# Patient Record
Sex: Female | Born: 1995 | Hispanic: Yes | Marital: Single | Smoking: Never smoker
Health system: Southern US, Community
[De-identification: ages and names within clinical notes are randomized; demographics above are authoritative.]

## PROBLEM LIST (undated history)

## (undated) ENCOUNTER — Inpatient Hospital Stay (HOSPITAL_COMMUNITY): Payer: Self-pay

---
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
## 2015-10-23 ENCOUNTER — Encounter: Payer: Self-pay | Admitting: Pediatrics

## 2015-10-24 ENCOUNTER — Encounter: Payer: Self-pay | Admitting: Pediatrics

## 2016-05-06 ENCOUNTER — Ambulatory Visit: Payer: Medicaid Other | Admitting: Pediatrics

## 2016-05-06 ENCOUNTER — Ambulatory Visit (INDEPENDENT_AMBULATORY_CARE_PROVIDER_SITE_OTHER): Payer: Medicaid Other | Admitting: Family

## 2016-05-06 ENCOUNTER — Encounter: Payer: Self-pay | Admitting: Family

## 2016-05-06 VITALS — BP 91/54 | HR 84 | Ht 58.5 in | Wt 93.6 lb

## 2016-05-06 DIAGNOSIS — Z113 Encounter for screening for infections with a predominantly sexual mode of transmission: Secondary | ICD-10-CM

## 2016-05-06 DIAGNOSIS — Z3049 Encounter for surveillance of other contraceptives: Secondary | ICD-10-CM

## 2016-05-06 DIAGNOSIS — Z3046 Encounter for surveillance of implantable subdermal contraceptive: Secondary | ICD-10-CM

## 2016-05-06 DIAGNOSIS — Z30433 Encounter for removal and reinsertion of intrauterine contraceptive device: Secondary | ICD-10-CM | POA: Diagnosis not present

## 2016-05-06 DIAGNOSIS — Z30017 Encounter for initial prescription of implantable subdermal contraceptive: Secondary | ICD-10-CM

## 2016-05-06 MED ORDER — ETONOGESTREL 68 MG ~~LOC~~ IMPL
68.0000 mg | DRUG_IMPLANT | Freq: Once | SUBCUTANEOUS | Status: AC
  Administered 2016-05-06: 68 mg via SUBCUTANEOUS

## 2016-05-06 NOTE — Patient Instructions (Signed)
Follow-up  in 1 month. Schedule this appointment before you leave clinic today.  Congratulations on getting your Nexplanon placement!  Below is some important information about Nexplanon.  First remember that Nexplanon does not prevent sexually transmitted infections.  Condoms will help prevent sexually transmitted infections. The Nexplanon starts working 7 days after it was inserted.  There is a risk of getting pregnant if you have unprotected sex in those first 7 days after placement of the Nexplanon.  The Nexplanon lasts for 3 years but can be removed at any time.  You can become pregnant as early as 1 week after removal.  You can have a new Nexplanon put in after the old one is removed if you like.  It is not known whether Nexplanon is as effective in women who are very overweight because the studies did not include many overweight women.  Nexplanon interacts with some medications, including barbiturates, bosentan, carbamazepine, felbamate, griseofulvin, oxcarbazepine, phenytoin, rifampin, St. John's wort, topiramate, HIV medicines.  Please alert your doctor if you are on any of these medicines.  Always tell other healthcare providers that you have a Nexplanon in your arm.  The Nexplanon was placed just under the skin.  Leave the outside bandage on for 24 hours.  Leave the smaller bandage on for 3-5 days or until it falls off on its own.  Keep the area clean and dry for 3-5 days. There is usually bruising or swelling at the insertion site for a few days to a week after placement.  If you see redness or pus draining from the insertion site, call us immediately.  Keep your user card with the date the implant was placed and the date the implant is to be removed.  The most common side effect is a change in your menstrual bleeding pattern.   This bleeding is generally not harmful to you but can be annoying.  Call or come in to see us if you have any concerns about the bleeding or if you have any  side effects or questions.    We will call you in 1 week to check in and we would like you to return to the clinic for a follow-up visit in 1 month.  You can call [facility] 24 hours a day with any questions or concerns.  There is always a nurse or doctor available to take your call.  Call 9-1-1 if you have a life-threatening emergency.  For anything else, please call us at [phone] before heading to the ER. 

## 2016-05-06 NOTE — Progress Notes (Signed)
THIS RECORD MAY CONTAIN CONFIDENTIAL INFORMATION THAT SHOULD NOT BE RELEASED WITHOUT REVIEW OF THE SERVICE PROVIDER.  Adolescent Medicine Consultation Follow-Up Visit Ardelia Wrede  is a G65P1 21 y.o. female referred by No ref. provider found here today for follow-up regarding nexplanon removal and reinsertion.   Last seen in Adolescent Medicine Clinic on 05/05/13 for nexplanon insertion.   - Pertinent Labs? No - Growth Chart Viewed? no   History was provided by the patient.  PCP Confirmed?  no  My Chart Activated?   no    Chief Complaint  Patient presents with  . Follow-up  . reproductive health    HPI:    Returns for removal and reinsertion.   No LMP recorded (lmp unknown). No Known Allergies Outpatient Medications Prior to Visit  Medication Sig Dispense Refill  . ondansetron (ZOFRAN ODT) 4 MG disintegrating tablet Take 1 tablet (4 mg total) by mouth every 6 (six) hours as needed for nausea. 20 tablet 0  . Prenatal Vit-Fe Fumarate-FA (PRENATAL MULTIVITAMIN) TABS tablet Take 1 tablet by mouth daily at 12 noon.     No facility-administered medications prior to visit.      Patient Active Problem List   Diagnosis Date Noted  . Teen mom 04/19/2013  . Breastfeeding (infant) 04/19/2013  . PROM (premature rupture of membranes) 04/16/2013     The following portions of the patient's history were reviewed and updated as appropriate: allergies, current medications and past medical history.  Physical Exam:  Vitals:   05/06/16 1332 05/06/16 1334  BP: (!) 90/54 (!) 91/54  Pulse: 69 84  Weight: 93 lb 9.6 oz (42.5 kg)   Height: 4' 10.5" (1.486 m)    BP (!) 91/54   Pulse 84   Ht 4' 10.5" (1.486 m)   Wt 93 lb 9.6 oz (42.5 kg)   LMP  (LMP Unknown)   BMI 19.23 kg/m  Body mass index: body mass index is 19.23 kg/m. Growth percentile SmartLinks can only be used for patients less than 53 years old.   Physical Exam  Constitutional: She is oriented to person,  place, and time.  Well-appearing   Cardiovascular: Normal rate.   Pulmonary/Chest: Effort normal.  Musculoskeletal: Normal range of motion. She exhibits no edema.  Neurological: She is alert and oriented to person, place, and time.  Skin: Skin is warm and dry. No rash noted.  Psychiatric: She has a normal mood and affect.    Assessment/Plan: 1. Encounter for Nexplanon removal Risks & benefits of Nexplanon removal discussed. Consent form signed.  The patient denies any allergies to anesthetics or antiseptics.  Procedure: Pt was placed in supine position. left arm was flexed at the elbow and externally rotated so that her wrist was parallel to her ear, The device was palpated and marked. The site was cleaned with Betadine. The area surrounding the device was covered with a sterile drape. 1% lidocaine was injected just under the device. A scalpel was used to create a small incision. The device was pushed towards the incision. Fibrous tissue surrounding the device was gradually removed from the device. The device was removed and measured to ensure all 4 cm of device was removed. Steri-strips were used to close the incision. Pressure dressing was applied to the patient.  The patient was instructed to removed the pressure dressing in 24 hrs.  The patient was advised to move slowly from a supine to an upright position  The patient denied any concerns or complaints  The patient was instructed  to schedule a follow-up appt in 1 month. The patient will be called in 1 week to address any concerns.  2. Insertion of Nexplanon Per procedure note - etonogestrel (NEXPLANON) implant 68 mg; 68 mg by Subdermal route once. - Subdermal Etonogestrel Implant Insertion  3. Routine screening for STI (sexually transmitted infection) -per protocol  - GC/Chlamydia Probe Amp   Follow-up:  Return in about 4 weeks (around 06/03/2016) for with any [facility] provider, Nexplanon Follow-Up.

## 2016-05-07 ENCOUNTER — Telehealth: Payer: Self-pay | Admitting: *Deleted

## 2016-05-07 LAB — GC/CHLAMYDIA PROBE AMP
CT Probe RNA: DETECTED — AB
GC Probe RNA: NOT DETECTED

## 2016-05-07 NOTE — Telephone Encounter (Signed)
-----   Message from Verneda Skillaroline T Hacker, FNP sent at 05/07/2016  3:37 PM EST ----- Positive for chlamydia. Needs treatment. Please advise patient we can treat in clinic and provide EPT for her or we can send treatment for just her to the pharmacy if she wishes. She should notify all partners and abstain from sex for at least 7 days after treatment.

## 2016-05-07 NOTE — Telephone Encounter (Signed)
Tried calling patient but mom answered and speaks very little english. Mom was able to state that Laura Dorsey was not home. Will try calling patient later.

## 2016-05-12 NOTE — Procedures (Signed)
Nexplanon Insertion  No contraindications for placement.  No liver disease, no unexplained vaginal bleeding, no h/o breast cancer, no h/o blood clots.  No LMP recorded (lmp unknown).  UHCG: negative  Last Unprotected sex:  NA  Risks & benefits of Nexplanon discussed The nexplanon device was purchased and supplied by [facility]. Packaging instructions supplied to patient Consent form signed  The patient denies any allergies to anesthetics or antiseptics.  Procedure: Pt was placed in supine position. The left arm was flexed at the elbow and externally rotated so that her wrist was parallel to her ear The medial epicondyle of the left arm was identified The insertions site was marked 8 cm proximal to the medial epicondyle The insertion site was cleaned with Betadine The area surrounding the insertion site was covered with a sterile drape 1% lidocaine was injected just under the skin at the insertion site extending 4 cm proximally. The sterile preloaded disposable Nexaplanon applicator was removed from the sterile packaging The applicator needle was inserted at a 30 degree angle at 8 cm proximal to the medial epicondyle as marked The applicator was lowered to a horizontal position and advanced just under the skin for the full length of the needle The slider on the applicator was retracted fully while the applicator remained in the same position, then the applicator was removed. The implant was confirmed via palpation as being in position The implant position was demonstrated to the patient Pressure dressing was applied to the patient.  The patient was instructed to removed the pressure dressing in 24 hrs.  The patient was advised to move slowly from a supine to an upright position  The patient denied any concerns or complaints  The patient was instructed to schedule a follow-up appt in 1 month and to call sooner if any concerns.  The patient acknowledged agreement and understanding of  the plan.  

## 2016-05-14 NOTE — Telephone Encounter (Signed)
Spoke with patient and let her know she was Positive for chlamydia. Needs treatment. Please advise patient we can treat in clinic and provide EPT for her and she should notify all partners and abstain from sex for at least 7 days after treatment. Patient had a few questions about what Chlamydia was and how she got it. I let her know that it was a sexually infection and that she can get it every time she has unprotected sex with a partner who has it and goes untreated. I reminded her of the importance of treatment and telling all partners to be treated so that she does not become reinfected. She stated understanding and we ended the phone call.

## 2016-05-14 NOTE — Telephone Encounter (Signed)
-----   Message from Verneda Skillaroline T Hacker, FNP sent at 05/07/2016  3:37 PM EST ----- Positive for chlamydia. Needs treatment. Please advise patient we can treat in clinic and provide EPT for her or we can send treatment for just her to the pharmacy if she wishes. She should notify all partners and abstain from sex for at least 7 days after treatment.

## 2016-05-22 ENCOUNTER — Ambulatory Visit (INDEPENDENT_AMBULATORY_CARE_PROVIDER_SITE_OTHER): Payer: Medicaid Other | Admitting: Family

## 2016-05-22 ENCOUNTER — Encounter: Payer: Self-pay | Admitting: Family

## 2016-05-22 VITALS — BP 103/59 | HR 88 | Ht 58.66 in | Wt 91.0 lb

## 2016-05-22 DIAGNOSIS — A749 Chlamydial infection, unspecified: Secondary | ICD-10-CM | POA: Diagnosis not present

## 2016-05-22 MED ORDER — AZITHROMYCIN 500 MG PO TABS
1000.0000 mg | ORAL_TABLET | Freq: Once | ORAL | Status: AC
  Administered 2016-05-22: 1000 mg via ORAL

## 2016-05-22 NOTE — Progress Notes (Signed)
History was provided by the patient.  Laura Dorsey is a 21 y.o. female who is here for tx of chlamydia.   PCP confirmed? Yes.    No primary care provider on file.  HPI:    Not currently sexually active.  Last sexual activity last year.   Has had some period like cramps that lasted for about 8 hours last week. Denies vaginal bleeding. Denies itching. Some white discharge. Denies fevers.   Review of Systems  Constitutional: Negative for malaise/fatigue.  Eyes: Negative for double vision.  Respiratory: Negative for shortness of breath.   Cardiovascular: Negative for chest pain and palpitations.  Gastrointestinal: Negative for abdominal pain, constipation, diarrhea, nausea and vomiting.  Genitourinary: Negative for dysuria.  Musculoskeletal: Negative for joint pain and myalgias.  Skin: Negative for rash.  Neurological: Negative for dizziness and headaches.  Endo/Heme/Allergies: Does not bruise/bleed easily.     Patient Active Problem List   Diagnosis Date Noted  . Teen mom 04/19/2013  . Breastfeeding (infant) 04/19/2013  . PROM (premature rupture of membranes) 04/16/2013    No current outpatient prescriptions on file prior to visit.   No current facility-administered medications on file prior to visit.     No Known Allergies  Physical Exam:    Vitals:   05/22/16 1026  BP: (!) 103/59  Pulse: 88  Weight: 91 lb (41.3 kg)  Height: 4' 10.66" (1.49 m)    Growth percentile SmartLinks can only be used for patients less than 21 years old. No LMP recorded (lmp unknown).  Physical Exam  Constitutional: She appears well-developed. No distress.  HENT:  Mouth/Throat: Oropharynx is clear and moist.  Neck: No thyromegaly present.  Cardiovascular: Normal rate and regular rhythm.   No murmur heard. Pulmonary/Chest: Breath sounds normal.  Abdominal: Soft. She exhibits no mass. There is no tenderness. There is no guarding.  Musculoskeletal: She exhibits no edema.   Lymphadenopathy:    She has no cervical adenopathy.  Neurological: She is alert.  Skin: Skin is warm. No rash noted.  Psychiatric: She has a normal mood and affect.  Nursing note and vitals reviewed.    Assessment/Plan: 1. Chlamydia Treated today. Provided EPT. Discussed return precautions.  - azithromycin (ZITHROMAX) tablet 1,000 mg; Take 2 tablets (1,000 mg total) by mouth once.

## 2016-05-22 NOTE — Patient Instructions (Addendum)
No sex for 7 days. Take 2 pills to your partner.   Chlamydia, Female Chlamydia is an infection. It is spread from one person to another person during sexual contact. This infection can be in the cervix, urine tube (urethra), throat, or bottom (rectum). This infection needs treatment. HOME CARE   Take your medicines (antibiotics) as told. Finish them even if you start to feel better.  Only take medicine as told by your doctor.  Tell your sex partner(s) that you have chlamydia. They must also be treated.  Do not have sex until your doctor says it is okay.  Rest.  Eat healthy. Drink enough fluids to keep your pee (urine) clear or pale yellow.  Keep all doctor visits as told. GET HELP IF:  You have pain when you pee.  You have belly pain.  You have vaginal discharge.  You have pain during sex.  You have bleeding between periods and after sex.  You have a fever. GET HELP RIGHT AWAY IF:   You feel sick to your stomach (nauseous) or you throw up (vomit).  You sweat much more than normal (diaphoresis).  You have trouble swallowing. This information is not intended to replace advice given to you by your health care provider. Make sure you discuss any questions you have with your health care provider. Document Released: 12/24/2007 Document Revised: 07/08/2015 Document Reviewed: 11/21/2012 Elsevier Interactive Patient Education  2017 [employer]

## 2016-06-03 ENCOUNTER — Ambulatory Visit: Payer: Self-pay | Admitting: Family

## 2016-07-17 ENCOUNTER — Ambulatory Visit (INDEPENDENT_AMBULATORY_CARE_PROVIDER_SITE_OTHER): Payer: Medicaid Other | Admitting: Family

## 2016-07-17 ENCOUNTER — Encounter: Payer: Self-pay | Admitting: Family

## 2016-07-17 VITALS — BP 105/65 | HR 83 | Ht 58.66 in | Wt 97.4 lb

## 2016-07-17 DIAGNOSIS — Z3202 Encounter for pregnancy test, result negative: Secondary | ICD-10-CM

## 2016-07-17 DIAGNOSIS — Z113 Encounter for screening for infections with a predominantly sexual mode of transmission: Secondary | ICD-10-CM

## 2016-07-17 DIAGNOSIS — N898 Other specified noninflammatory disorders of vagina: Secondary | ICD-10-CM

## 2016-07-17 LAB — POCT URINE PREGNANCY: Preg Test, Ur: NEGATIVE

## 2016-07-17 NOTE — Progress Notes (Signed)
THIS RECORD MAY CONTAIN CONFIDENTIAL INFORMATION THAT SHOULD NOT BE RELEASED WITHOUT REVIEW OF THE SERVICE PROVIDER.  Adolescent Medicine Consultation Follow-Up Visit Laura Dorsey  is a 21 y.o. female referred by No ref. provider found here today for follow-up regarding vaginal discharge.   Last seen in [facility] on 05/22/16  for chlamydia tx in office.   - Pertinent Labs? Yes  - Growth Chart Viewed? not applicable   History was provided by the patient.  PCP Confirmed?  no  My Chart Activated?   no   Chief Complaint  Patient presents with  . Follow-up    patient has concerns that last time Azithro was given she had vomited one hour after administration; concerned it did not clear with antibiotics    HPI:    -Threw up right after she left here last visit. Partner not treated.  -Chlamydia tx and she is concerned that she may not have been treated.  -Sometimes having intermittent cramping; no pain with sex.  -No vaginal bleeding; having some smell with vaginal discharge and sometimes yellowish.   Review of Systems  Constitutional: Negative for malaise/fatigue.  Eyes: Negative for double vision.  Respiratory: Negative for shortness of breath.   Cardiovascular: Negative for chest pain and palpitations.  Gastrointestinal: Negative for abdominal pain, constipation, diarrhea, nausea and vomiting.  Genitourinary: Negative for dysuria.  Musculoskeletal: Negative for joint pain and myalgias.  Skin: Negative for rash.  Neurological: Negative for dizziness and headaches.  Endo/Heme/Allergies: Does not bruise/bleed easily.    No LMP recorded (lmp unknown). No Known Allergies No outpatient prescriptions prior to visit.   No facility-administered medications prior to visit.      Patient Active Problem List   Diagnosis Date Noted  . Teen mom 04/19/2013   The following portions of the patient's history were reviewed and updated as appropriate:  allergies, current medications, past medical history, past social history and problem list.  Physical Exam:  Vitals:   07/17/16 1507  BP: 105/65  Pulse: 83  Weight: 97 lb 6.4 oz (44.2 kg)  Height: 4' 10.66" (1.49 m)   BP 105/65   Pulse 83   Ht 4' 10.66" (1.49 m)   Wt 97 lb 6.4 oz (44.2 kg)   LMP  (LMP Unknown)   Breastfeeding? No   BMI 19.90 kg/m  Body mass index: body mass index is 19.9 kg/m. Growth percentile SmartLinks can only be used for patients less than 41 years old.  Wt Readings from Last 3 Encounters:  07/17/16 97 lb 6.4 oz (44.2 kg)  05/22/16 91 lb (41.3 kg)  05/06/16 93 lb 9.6 oz (42.5 kg)    Physical Exam  Constitutional: She is oriented to person, place, and time. She appears well-developed. No distress.  HENT:  Head: Normocephalic and atraumatic.  Eyes: EOM are normal. Pupils are equal, round, and reactive to light. No scleral icterus.  Neck: Normal range of motion. Neck supple. No thyromegaly present.  Cardiovascular: Normal rate, regular rhythm, normal heart sounds and intact distal pulses.   No murmur heard. Pulmonary/Chest: Effort normal and breath sounds normal.  Abdominal: Soft.  Genitourinary: Vaginal discharge (egg-white conistency vaginal discharge) found.  Musculoskeletal: Normal range of motion. She exhibits no edema.  Lymphadenopathy:    She has no cervical adenopathy.  Neurological: She is alert and oriented to person, place, and time. No cranial nerve deficit.  Skin: Skin is warm and dry. No rash noted.  Psychiatric: She has a normal mood and affect. Her behavior  is normal. Judgment and thought content normal.  Vitals reviewed.   Assessment/Plan: 1. Vaginal discharge - exam normal, will await results for tx  - WET PREP BY MOLECULAR PROBE  2. Routine screening for STI (sexually transmitted infection) - test of reinfection today  - GC/Chlamydia Probe Amp  3. Negative pregnancy test -negative - POCT urine pregnancy   Follow-up:   Return pending lab results .   Medical decision-making:  >15 minutes spent face to face with patient with more than 50% of appointment spent discussing diagnosis, management, follow-up, medications to treat yeast, BV and chlamydia; discussed partner treatment, return precautions, pregnancy intention, and contraceptive options.

## 2016-07-17 NOTE — Patient Instructions (Signed)
Please call clinic next week with your new phone number so we can reach you.  Also, try to set up a My Chart account so that we can communicate that way!

## 2016-07-18 LAB — WET PREP BY MOLECULAR PROBE
Candida species: NOT DETECTED
Gardnerella vaginalis: DETECTED — AB
Trichomonas vaginosis: NOT DETECTED

## 2016-07-18 LAB — GC/CHLAMYDIA PROBE AMP
CT PROBE, AMP APTIMA: NOT DETECTED
GC Probe RNA: NOT DETECTED

## 2016-07-20 ENCOUNTER — Other Ambulatory Visit: Payer: Self-pay | Admitting: Family

## 2016-07-20 MED ORDER — METRONIDAZOLE 500 MG PO TABS: 500.0000 mg | ORAL_TABLET | Freq: Two times a day (BID) | ORAL | 0 refills | Status: DC

## 2016-07-28 ENCOUNTER — Telehealth: Payer: Self-pay

## 2016-07-28 NOTE — Telephone Encounter (Signed)
Patient told us her phone was being turned off at appointment, but would call us back. We have not received a call from patient. Tried to call number listed in snap shot and person on phone states there is not any one there by that name. Called pharmacy to see if she has picked up the prescription and she has not yet. They have a different phone number on file for her. [phone]. This number went straight to voicemail. Left message to call back regarding lab results.

## 2016-07-29 NOTE — Telephone Encounter (Signed)
Called with interpreter services and was sent straight to voicemail. Left a message asking to call back regarding lab results.

## 2016-08-21 ENCOUNTER — Ambulatory Visit: Payer: Self-pay | Admitting: Family

## 2016-09-09 ENCOUNTER — Ambulatory Visit (INDEPENDENT_AMBULATORY_CARE_PROVIDER_SITE_OTHER): Payer: Medicaid Other | Admitting: Family

## 2016-09-09 VITALS — BP 114/58 | HR 83 | Ht 58.27 in | Wt 97.2 lb

## 2016-09-09 DIAGNOSIS — Z975 Presence of (intrauterine) contraceptive device: Secondary | ICD-10-CM

## 2016-09-09 DIAGNOSIS — Z113 Encounter for screening for infections with a predominantly sexual mode of transmission: Secondary | ICD-10-CM

## 2016-09-09 MED ORDER — AZITHROMYCIN 500 MG PO TABS
1000.0000 mg | ORAL_TABLET | Freq: Once | ORAL | Status: AC
  Administered 2016-09-09: 1000 mg via ORAL

## 2016-09-09 NOTE — Progress Notes (Signed)
THIS RECORD MAY CONTAIN CONFIDENTIAL INFORMATION THAT SHOULD NOT BE RELEASED WITHOUT REVIEW OF THE SERVICE PROVIDER.  Adolescent Medicine Consultation Follow-Up Visit Laura Dorsey  is a 21 y.o. female referred by No ref. provider found here today for follow-up regarding  nexplanon follow-up.  Last seen in [facility] on 09/14/16 for chlamydia.   - Pertinent Labs? No - Growth Chart Viewed? no   History was provided by the patient.  PCP Confirmed?  no  My Chart Activated?   no    Chief Complaint  Patient presents with  . Follow-up  . Reproductive Health    HPI:    No period with Nexplanon.  Likes the method.  Denies vaginal discharge, no lesions, no pelvic or abdominal pain. Denies dyspareunia or dysuria.   Depressed feeling since Nexplanon insertion. PHQSADS today.  No feelings of self-harm or self-harm behaviors described.  Interested in how medications work for depression.  Declined BH intervention today.   Review of Systems  Constitutional: Negative for malaise/fatigue.  Eyes: Negative for double vision.  Respiratory: Negative for shortness of breath.   Cardiovascular: Negative for chest pain and palpitations.  Gastrointestinal: Negative for abdominal pain, constipation, diarrhea, nausea and vomiting.  Genitourinary: Negative for dysuria.  Musculoskeletal: Negative for joint pain and myalgias.  Skin: Negative for rash.  Neurological: Negative for dizziness and headaches.  Endo/Heme/Allergies: Does not bruise/bleed easily.     No LMP recorded. No Known Allergies Outpatient Medications Prior to Visit  Medication Sig Dispense Refill  . metroNIDAZOLE (FLAGYL) 500 MG tablet Take 1 tablet (500 mg total) by mouth 2 (two) times daily. 14 tablet 0   No facility-administered medications prior to visit.      Patient Active Problem List   Diagnosis Date Noted  . Teen mom 04/19/2013   The following portions of the patient's history were  reviewed and updated as appropriate: allergies, current medications, past medical history and problem list.  Physical Exam:  Vitals:   09/09/16 1402  BP: (!) 114/58  Pulse: 83  Weight: 97 lb 3.2 oz (44.1 kg)  Height: 4' 10.27" (1.48 m)   BP (!) 114/58 (BP Location: Right Arm, Patient Position: Sitting, Cuff Size: Small)   Pulse 83   Ht 4' 10.27" (1.48 m)   Wt 97 lb 3.2 oz (44.1 kg)   BMI 20.13 kg/m  Body mass index: body mass index is 20.13 kg/m. Growth percentile SmartLinks can only be used for patients less than 46 years old.  Wt Readings from Last 3 Encounters:  09/09/16 97 lb 3.2 oz (44.1 kg)  07/17/16 97 lb 6.4 oz (44.2 kg)  05/22/16 91 lb (41.3 kg)    Physical Exam  Constitutional: She is oriented to person, place, and time. She appears well-developed. No distress.  HENT:  Head: Normocephalic and atraumatic.  Eyes: EOM are normal. Pupils are equal, round, and reactive to light. No scleral icterus.  Neck: Normal range of motion. Neck supple. No thyromegaly present.  Cardiovascular: Normal rate, regular rhythm, normal heart sounds and intact distal pulses.   No murmur heard. Pulmonary/Chest: Effort normal and breath sounds normal.  Abdominal: Soft.  Musculoskeletal: Normal range of motion. She exhibits no edema.  Lymphadenopathy:    She has no cervical adenopathy.  Neurological: She is alert and oriented to person, place, and time. No cranial nerve deficit.  Skin: Skin is warm and dry. No rash noted.  Psychiatric: She has a normal mood and affect. Her behavior is normal. Judgment and thought content  normal.    Assessment/Plan: 1. Nexplanon in place -stable on this method -continue and monitor mood  -doubtful related to progesterone; reviewed return precautions.   2. Routine screening for STI (sexually transmitted infection)  - GC/Chlamydia Probe Amp - HIV antibody - RPR   Follow-up:  Return if symptoms worsen or fail to improve, for with Christianne Dolinhristy Millican,  FNP-C.   Medical decision-making:  >15 minutes spent face to face with patient with more than 50% of appointment spent discussing diagnosis, management, follow-up, and reviewing of plan of care.

## 2016-09-10 LAB — HIV ANTIBODY (ROUTINE TESTING W REFLEX): HIV 1&2 Ab, 4th Generation: NONREACTIVE

## 2016-09-10 LAB — GC/CHLAMYDIA PROBE AMP
CT Probe RNA: NOT DETECTED
GC Probe RNA: NOT DETECTED

## 2016-09-10 LAB — RPR

## 2016-09-14 ENCOUNTER — Encounter: Payer: Self-pay | Admitting: Family

## 2019-03-09 ENCOUNTER — Emergency Department (HOSPITAL_COMMUNITY)
Admission: EM | Admit: 2019-03-09 | Discharge: 2019-03-09 | Disposition: A | Discharge status: Home / Self Care | Payer: Self-pay | Attending: Emergency Medicine | Admitting: Emergency Medicine

## 2019-03-09 ENCOUNTER — Other Ambulatory Visit: Payer: Self-pay

## 2019-03-09 DIAGNOSIS — N898 Other specified noninflammatory disorders of vagina: Secondary | ICD-10-CM | POA: Insufficient documentation

## 2019-03-09 DIAGNOSIS — R109 Unspecified abdominal pain: Secondary | ICD-10-CM

## 2019-03-09 DIAGNOSIS — R1084 Generalized abdominal pain: Secondary | ICD-10-CM | POA: Insufficient documentation

## 2019-03-09 LAB — COMPREHENSIVE METABOLIC PANEL
ALT: 17 U/L (ref 0–44)
AST: 18 U/L (ref 15–41)
Albumin: 4.5 g/dL (ref 3.5–5.0)
Alkaline Phosphatase: 61 U/L (ref 38–126)
Anion gap: 11 (ref 5–15)
BUN: 10 mg/dL (ref 6–20)
CO2: 22 mmol/L (ref 22–32)
Calcium: 9.2 mg/dL (ref 8.9–10.3)
Chloride: 105 mmol/L (ref 98–111)
Creatinine, Ser: 0.61 mg/dL (ref 0.44–1.00)
GFR calc Af Amer: >60 mL/min (ref >60)
GFR calc non Af Amer: >60 mL/min (ref >60)
Glucose, Bld: 102 mg/dL — ABNORMAL HIGH (ref 70–99)
Potassium: 3.6 mmol/L (ref 3.5–5.1)
Sodium: 138 mmol/L (ref 135–145)
Total Bilirubin: 0.7 mg/dL (ref 0.3–1.2)
Total Protein: 7.3 g/dL (ref 6.5–8.1)

## 2019-03-09 LAB — CBC
HCT: 41.4 % (ref 36.0–46.0)
Hemoglobin: 13.8 g/dL (ref 12.0–15.0)
MCH: 30.1 pg (ref 26.0–34.0)
MCHC: 33.3 g/dL (ref 30.0–36.0)
MCV: 90.4 fL (ref 80.0–100.0)
Platelets: 289 10*3/uL (ref 150–400)
RBC: 4.58 MIL/uL (ref 3.87–5.11)
RDW: 12.3 % (ref 11.5–15.5)
WBC: 9.7 10*3/uL (ref 4.0–10.5)
nRBC: 0 % (ref 0.0–0.2)

## 2019-03-09 LAB — I-STAT BETA HCG BLOOD, ED (MC, WL, AP ONLY): I-stat hCG, quantitative: <5 m[IU]/mL (ref <5)

## 2019-03-09 LAB — URINALYSIS, ROUTINE W REFLEX MICROSCOPIC
Bilirubin Urine: NEGATIVE
Glucose, UA: NEGATIVE mg/dL
Hgb urine dipstick: NEGATIVE
Ketones, ur: NEGATIVE mg/dL
Leukocytes,Ua: NEGATIVE
Nitrite: NEGATIVE
Protein, ur: NEGATIVE mg/dL
Specific Gravity, Urine: 1.009 (ref 1.005–1.030)
pH: 7 (ref 5.0–8.0)

## 2019-03-09 LAB — WET PREP, GENITAL
Clue Cells Wet Prep HPF POC: NONE SEEN
Sperm: NONE SEEN
Trich, Wet Prep: NONE SEEN
Yeast Wet Prep HPF POC: NONE SEEN

## 2019-03-09 LAB — LIPASE, BLOOD: Lipase: 23 U/L (ref 11–51)

## 2019-03-09 NOTE — Discharge Instructions (Addendum)
Please read the instructions below.  Please schedule an appointment for follow up with your OBGYN or primary care.  You will receive a call from the hospital if your test results come back positive. Avoid all sexual activity until you know your test results. If your results come back positive, it is important that you inform all of your sexual partners.  Return to the ER for high fever, severe abdominal pain, or new or worsening symptoms.

## 2019-03-09 NOTE — ED Triage Notes (Signed)
Pt endorses lower back pain radiating to lower abd. Took a pregnancy test that was negative. Hasn't had a period in 2.5 months but is also on birth control. Wants to be tested for STDs and pregnancy test.

## 2019-03-09 NOTE — ED Provider Notes (Signed)
[facility] EMERGENCY DEPARTMENT Provider Note   CSN: [id number] Arrival date & time: 03/09/19  1735     History Chief Complaint  Patient presents with  . Back Pain  . Abdominal Pain    Laura Dorsey is a 23 y.o. female G1, P1, presenting the emergency department with complaint of bilateral lower abdominal cramping that began this morning.  She states it feels similar to period cramps, however she has not had her period in 2.5 months after she had the Nexplanon placed.  She states she has having some chronic low back pain as well that is unchanged from her usual.  She is also complaining of intermittent vaginal itching.  She has intermittent white vaginal discharge that is unchanged from her usual.  Denies associated nausea, vomiting, urinary symptoms, diarrhea, constipation, fevers.  She is sexually active with female partner without protection.  The history is provided by the patient.       No past medical history on file.  Patient Active Problem List   Diagnosis Date Noted  . Teen mom 04/19/2013    No past surgical history on file.   OB History    Gravida  1   Para  1   Term  1   Preterm      AB      Living  1     SAB      TAB      Ectopic      Multiple      Live Births  1           No family history on file.  Social History   Tobacco Use  . Smoking status: Never Smoker  . Smokeless tobacco: Never Used  Substance Use Topics  . Alcohol use: No  . Drug use: No    Home Medications Prior to Admission medications   Not on File    Allergies    Patient has no known allergies.  Review of Systems   Review of Systems  All other systems reviewed and are negative.   Physical Exam Updated Vital Signs BP (!) 94/57 (BP Location: Left Arm)   Pulse 84   Temp 98.8 F (37.1 C) (Oral)   Resp 16   LMP 01/07/2019   SpO2 100%   Physical Exam Vitals and nursing note reviewed. Exam conducted with a chaperone  present.  Constitutional:      General: She is not in acute distress.    Appearance: She is well-developed. She is not ill-appearing.  HENT:     Head: Normocephalic and atraumatic.  Eyes:     Conjunctiva/sclera: Conjunctivae normal.  Cardiovascular:     Rate and Rhythm: Normal rate and regular rhythm.  Pulmonary:     Effort: Pulmonary effort is normal. No respiratory distress.     Breath sounds: Normal breath sounds.  Abdominal:     General: Bowel sounds are normal.     Palpations: Abdomen is soft.     Tenderness: There is no abdominal tenderness. There is no guarding or rebound.  Genitourinary:    Labia:        Right: No rash or tenderness.        Left: No rash or tenderness.      Cervix: No cervical motion tenderness.     Adnexa: Right adnexa normal and left adnexa normal.     Comments: Exam performed with female NT chaperone present.  Small amount of white discharge present. Skin:  General: Skin is warm.  Neurological:     Mental Status: She is alert.  Psychiatric:        Behavior: Behavior normal.     ED Results / Procedures / Treatments   Labs (all labs ordered are listed, but only abnormal results are displayed) Labs Reviewed  WET PREP, GENITAL - Abnormal; Notable for the following components:      Result Value   WBC, Wet Prep HPF POC MODERATE (*)    All other components within normal limits  COMPREHENSIVE METABOLIC PANEL - Abnormal; Notable for the following components:   Glucose, Bld 102 (*)    All other components within normal limits  URINALYSIS, ROUTINE W REFLEX MICROSCOPIC - Abnormal; Notable for the following components:   Color, Urine STRAW (*)    All other components within normal limits  LIPASE, BLOOD  CBC  I-STAT BETA HCG BLOOD, ED ([facility], [facility], [facility] ONLY)  GC/CHLAMYDIA PROBE AMP ([place]) NOT AT [facility]    EKG None  Radiology No results found.  Procedures Procedures (including critical care time)  Medications Ordered in ED Medications - No  data to display  ED Course  I have reviewed the triage vital signs and the nursing notes.  Pertinent labs & imaging results that were available during my care of the patient were reviewed by me and considered in my medical decision making (see chart for details).    MDM Rules/Calculators/A&P                        Pt presenting with lower abd cramping, similar to menstrual cramps. Abd exam is benign. Exam not concerning for PID because pt is hemodynamically stable, no cervical motion tenderness on pelvic exam. Wet prep with moderate WBC. Labs obtained in triage are unremarkable.  Beta hCG is negative.  Urine is negative for infection. Not concerning for intra-abdominal pathology of symptoms.  Patient to be discharged with instructions to follow up with OBGYN. Discussed importance of using protection when sexually active. Pt understands that they have STD cultures pending and that they will need to inform all sexual partners if results return positive.  Pt afebrile and nontoxic, safe for discharge home.  Discussed results, findings, treatment and follow up. Patient advised of return precautions. Patient verbalized understanding and agreed with plan.  Final Clinical Impression(s) / ED Diagnoses Final diagnoses:  None    Rx / DC Orders ED Discharge Orders    None       Loring Liskey, [place] N, PA-C 03/09/19 2026    Sabas Sous, MD 03/09/19 [phone]

## 2019-03-13 LAB — GC/CHLAMYDIA PROBE AMP (~~LOC~~) NOT AT ARMC
Chlamydia: NEGATIVE
Neisseria Gonorrhea: NEGATIVE

## 2019-03-16 ENCOUNTER — Telehealth (HOSPITAL_COMMUNITY): Payer: Self-pay

## 2019-07-13 ENCOUNTER — Other Ambulatory Visit: Payer: Self-pay

## 2019-07-13 ENCOUNTER — Ambulatory Visit (INDEPENDENT_AMBULATORY_CARE_PROVIDER_SITE_OTHER): Payer: Self-pay | Admitting: Family

## 2019-07-13 ENCOUNTER — Encounter: Payer: Self-pay | Admitting: Pediatrics

## 2019-07-13 ENCOUNTER — Encounter: Payer: Self-pay | Admitting: Family

## 2019-07-13 VITALS — BP 103/56 | HR 68 | Ht 59.65 in | Wt 114.8 lb

## 2019-07-13 DIAGNOSIS — Z113 Encounter for screening for infections with a predominantly sexual mode of transmission: Secondary | ICD-10-CM

## 2019-07-13 DIAGNOSIS — M545 Low back pain, unspecified: Secondary | ICD-10-CM

## 2019-07-13 DIAGNOSIS — Z3046 Encounter for surveillance of implantable subdermal contraceptive: Secondary | ICD-10-CM

## 2019-07-13 MED ORDER — LEVONORGESTREL 1.5 MG PO TABS: 1.5000 mg | ORAL_TABLET | Freq: Once | ORAL | 0 refills | Status: AC

## 2019-07-13 NOTE — Progress Notes (Signed)

## 2019-07-13 NOTE — Patient Instructions (Addendum)
Take plan b if needed for unprotected sex  Use condoms  Referral to [facility] for back pain- you can go ahead and call them  [phone] for an appointment!   Your Nexplanon was removed today and is no longer preventing pregnancy.  If you have sex, remember to use condoms to prevent pregnancy and to prevent sexually transmitted infections.  Leave the outside bandage on for 24 hours.  Leave the smaller bandages on for 3-5 days or until they fall off on their own.  Keep the area clean and dry for 3-5 days.  There is usually bruising or swelling at and around the removal site for a few days to a week after the removal.  If you see redness or pus draining from the removal site, call us immediately.  We would like you to return to the clinic for a follow-up visit in 1 month.  You can call [facility] 24 hours a day with any questions or concerns.  There is always a nurse or doctor available to take your call.  Call 9-1-1 if you have a life-threatening emergency.  For anything else, please call us at [phone] before heading to the ER.

## 2020-05-10 ENCOUNTER — Other Ambulatory Visit: Payer: Self-pay | Admitting: Student in an Organized Health Care Education/Training Program

## 2020-05-10 DIAGNOSIS — Z308 Encounter for other contraceptive management: Secondary | ICD-10-CM

## 2020-05-10 NOTE — Progress Notes (Signed)
Placed referral to get nexplanon, per patient request during her son's [facility]

## 2020-09-16 ENCOUNTER — Ambulatory Visit: Payer: Self-pay | Admitting: Pediatrics

## 2020-09-16 ENCOUNTER — Other Ambulatory Visit: Payer: Self-pay
